# Patient Record
Sex: Male | Born: 1990 | Race: Black or African American | Hispanic: No | Marital: Single
Health system: Southern US, Community
[De-identification: ages and names within clinical notes are randomized; demographics above are authoritative.]

## PROBLEM LIST (undated history)

## (undated) DIAGNOSIS — F209 Schizophrenia, unspecified: Secondary | ICD-10-CM

## (undated) DIAGNOSIS — H9190 Unspecified hearing loss, unspecified ear: Secondary | ICD-10-CM

---
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
## 2011-08-09 ENCOUNTER — Emergency Department: Payer: Self-pay

## 2011-09-09 ENCOUNTER — Emergency Department: Payer: Self-pay | Admitting: *Deleted

## 2011-10-21 ENCOUNTER — Emergency Department: Payer: Self-pay | Admitting: *Deleted

## 2012-04-07 ENCOUNTER — Emergency Department: Payer: Self-pay | Admitting: Emergency Medicine

## 2012-04-07 LAB — COMPREHENSIVE METABOLIC PANEL
Albumin: 3.5 g/dL (ref 3.4–5.0)
Anion Gap: 8 (ref 7–16)
Bilirubin,Total: 0.4 mg/dL (ref 0.2–1.0)
Calcium, Total: 8.6 mg/dL (ref 8.5–10.1)
Chloride: 109 mmol/L — ABNORMAL HIGH (ref 98–107)
Creatinine: 0.85 mg/dL (ref 0.60–1.30)
EGFR (African American): >60
EGFR (Non-African Amer.): >60
Osmolality: 282 (ref 275–301)
Potassium: 3.8 mmol/L (ref 3.5–5.1)
SGOT(AST): 24 U/L (ref 15–37)
SGPT (ALT): 20 U/L
Total Protein: 6.8 g/dL (ref 6.4–8.2)

## 2012-04-07 LAB — SALICYLATE LEVEL: Salicylates, Serum: <1.7 mg/dL

## 2012-04-07 LAB — DRUG SCREEN, URINE
Amphetamines, Ur Screen: NEGATIVE (ref ?–1000)
Barbiturates, Ur Screen: NEGATIVE (ref ?–200)
Benzodiazepine, Ur Scrn: POSITIVE (ref ?–200)
Cannabinoid 50 Ng, Ur ~~LOC~~: POSITIVE (ref ?–50)
Cocaine Metabolite,Ur ~~LOC~~: NEGATIVE (ref ?–300)
MDMA (Ecstasy)Ur Screen: NEGATIVE (ref ?–500)
Methadone, Ur Screen: NEGATIVE (ref ?–300)
Opiate, Ur Screen: NEGATIVE (ref ?–300)
Phencyclidine (PCP) Ur S: NEGATIVE (ref ?–25)
Tricyclic, Ur Screen: NEGATIVE (ref ?–1000)

## 2012-04-07 LAB — URINALYSIS, COMPLETE
Bacteria: NONE SEEN
Blood: NEGATIVE
Nitrite: NEGATIVE
Specific Gravity: 1.021 (ref 1.003–1.030)
Squamous Epithelial: 1
WBC UR: <1 /HPF (ref 0–5)

## 2012-04-07 LAB — CBC
HCT: 38.1 % — ABNORMAL LOW (ref 40.0–52.0)
MCHC: 33.6 g/dL (ref 32.0–36.0)
MCV: 86 fL (ref 80–100)
Platelet: 198 10*3/uL (ref 150–440)
RDW: 14.3 % (ref 11.5–14.5)
WBC: 4.4 10*3/uL (ref 3.8–10.6)

## 2012-04-07 LAB — TSH: Thyroid Stimulating Horm: 1.05 u[IU]/mL

## 2012-04-07 LAB — ETHANOL
Ethanol %: <0.003 % (ref 0.000–0.080)
Ethanol: <3 mg/dL

## 2012-09-10 ENCOUNTER — Emergency Department: Payer: Self-pay | Admitting: Emergency Medicine

## 2012-09-10 LAB — ETHANOL: Ethanol %: <0.003 % (ref 0.000–0.080)

## 2012-10-29 ENCOUNTER — Emergency Department: Payer: Self-pay | Admitting: Emergency Medicine

## 2014-01-26 ENCOUNTER — Observation Stay: Payer: Self-pay | Admitting: Internal Medicine

## 2014-01-26 LAB — CBC WITH DIFFERENTIAL/PLATELET
BASOS PCT: 0.8 %
Basophil #: 0.1 10*3/uL (ref 0.0–0.1)
EOS ABS: 0.1 10*3/uL (ref 0.0–0.7)
Eosinophil %: 1.2 %
HCT: 42.9 % (ref 40.0–52.0)
HGB: 14 g/dL (ref 13.0–18.0)
LYMPHS ABS: 1.8 10*3/uL (ref 1.0–3.6)
Lymphocyte %: 21.3 %
MCH: 28.4 pg (ref 26.0–34.0)
MCHC: 32.6 g/dL (ref 32.0–36.0)
MCV: 87 fL (ref 80–100)
MONO ABS: 0.5 x10 3/mm (ref 0.2–1.0)
MONOS PCT: 6.3 %
Neutrophil #: 5.8 10*3/uL (ref 1.4–6.5)
Neutrophil %: 70.4 %
Platelet: 199 10*3/uL (ref 150–440)
RBC: 4.92 10*6/uL (ref 4.40–5.90)
RDW: 13.9 % (ref 11.5–14.5)
WBC: 8.3 10*3/uL (ref 3.8–10.6)

## 2014-01-26 LAB — BASIC METABOLIC PANEL
Anion Gap: 14 (ref 7–16)
Anion Gap: 4 — ABNORMAL LOW (ref 7–16)
BUN: 14 mg/dL (ref 7–18)
BUN: 13 mg/dL (ref 7–18)
CALCIUM: 9 mg/dL (ref 8.5–10.1)
Calcium, Total: 9 mg/dL (ref 8.5–10.1)
Chloride: 103 mmol/L (ref 98–107)
Chloride: 108 mmol/L — ABNORMAL HIGH (ref 98–107)
Co2: 30 mmol/L (ref 21–32)
Co2: 26 mmol/L (ref 21–32)
Creatinine: 1.25 mg/dL (ref 0.60–1.30)
Creatinine: 1 mg/dL (ref 0.60–1.30)
EGFR (African American): >60
EGFR (Non-African Amer.): >60
GLUCOSE: 88 mg/dL (ref 65–99)
Glucose: 130 mg/dL — ABNORMAL HIGH (ref 65–99)
Osmolality: 292 (ref 275–301)
Osmolality: 278 (ref 275–301)
POTASSIUM: 3.3 mmol/L — AB (ref 3.5–5.1)
Potassium: 3.8 mmol/L (ref 3.5–5.1)
SODIUM: 147 mmol/L — AB (ref 136–145)
Sodium: 138 mmol/L (ref 136–145)

## 2014-01-26 LAB — CK TOTAL AND CKMB (NOT AT ARMC)
CK, Total: 280 U/L
CK, Total: 231 U/L
CK-MB: 1.3 ng/mL (ref 0.5–3.6)
CK-MB: 1.2 ng/mL (ref 0.5–3.6)

## 2014-01-26 LAB — URINALYSIS, COMPLETE
Bacteria: NONE SEEN
Bilirubin,UR: NEGATIVE
Glucose,UR: NEGATIVE mg/dL (ref 0–75)
Ketone: NEGATIVE
Leukocyte Esterase: NEGATIVE
Nitrite: NEGATIVE
Ph: 6 (ref 4.5–8.0)
Protein: NEGATIVE
RBC, UR: NONE SEEN /HPF (ref 0–5)
SQUAMOUS EPITHELIAL: NONE SEEN
Specific Gravity: 1.001 (ref 1.003–1.030)
WBC UR: NONE SEEN /HPF (ref 0–5)

## 2014-01-26 LAB — DRUG SCREEN, URINE

## 2014-01-26 LAB — TROPONIN I
Troponin-I: <0.02 ng/mL
Troponin-I: <0.02 ng/mL

## 2014-01-27 LAB — CBC WITH DIFFERENTIAL/PLATELET
Basophil #: 0 10*3/uL (ref 0.0–0.1)
Basophil %: 0.6 %
Eosinophil #: 0.1 10*3/uL (ref 0.0–0.7)
Eosinophil %: 2.4 %
HCT: 39.7 % — ABNORMAL LOW (ref 40.0–52.0)
HGB: 13.4 g/dL (ref 13.0–18.0)
Lymphocyte #: 1.9 10*3/uL (ref 1.0–3.6)
Lymphocyte %: 34.6 %
MCH: 29.6 pg (ref 26.0–34.0)
MCHC: 33.8 g/dL (ref 32.0–36.0)
MCV: 88 fL (ref 80–100)
MONO ABS: 0.4 x10 3/mm (ref 0.2–1.0)
Monocyte %: 7.9 %
Neutrophil #: 3.1 10*3/uL (ref 1.4–6.5)
Neutrophil %: 54.5 %
Platelet: 180 10*3/uL (ref 150–440)
RBC: 4.53 10*6/uL (ref 4.40–5.90)
RDW: 13.8 % (ref 11.5–14.5)
WBC: 5.6 10*3/uL (ref 3.8–10.6)

## 2014-01-27 LAB — BASIC METABOLIC PANEL
Anion Gap: 6 — ABNORMAL LOW (ref 7–16)
BUN: 12 mg/dL (ref 7–18)
CALCIUM: 8.7 mg/dL (ref 8.5–10.1)
CHLORIDE: 109 mmol/L — AB (ref 98–107)
Co2: 24 mmol/L (ref 21–32)
Creatinine: 1.02 mg/dL (ref 0.60–1.30)
EGFR (Non-African Amer.): >60
Glucose: 94 mg/dL (ref 65–99)
OSMOLALITY: 277 (ref 275–301)
POTASSIUM: 4.2 mmol/L (ref 3.5–5.1)
Sodium: 139 mmol/L (ref 136–145)

## 2014-01-27 LAB — MAGNESIUM: MAGNESIUM: 1.6 mg/dL — AB

## 2014-01-27 LAB — LIPID PANEL
CHOLESTEROL: 104 mg/dL (ref 0–200)
HDL Cholesterol: 41 mg/dL (ref 40–60)
Ldl Cholesterol, Calc: 48 mg/dL (ref 0–100)
Triglycerides: 77 mg/dL (ref 0–200)
VLDL CHOLESTEROL, CALC: 15 mg/dL (ref 5–40)

## 2014-02-17 ENCOUNTER — Emergency Department: Payer: Self-pay | Admitting: Emergency Medicine

## 2014-05-13 ENCOUNTER — Emergency Department: Payer: Self-pay | Admitting: Emergency Medicine

## 2014-09-18 ENCOUNTER — Emergency Department: Payer: Self-pay | Admitting: Emergency Medicine

## 2014-09-22 ENCOUNTER — Emergency Department: Payer: Self-pay | Admitting: Emergency Medicine

## 2014-11-24 ENCOUNTER — Ambulatory Visit: Payer: Self-pay | Admitting: Nurse Practitioner

## 2014-11-30 ENCOUNTER — Ambulatory Visit: Payer: Self-pay | Admitting: Nurse Practitioner

## 2015-03-06 ENCOUNTER — Emergency Department
Admit: 2015-03-06 | Disposition: A | Discharge status: Home / Self Care | Payer: Self-pay | Admitting: Emergency Medicine

## 2015-03-06 LAB — URINALYSIS, COMPLETE
BACTERIA: NONE SEEN
Bilirubin,UR: NEGATIVE
GLUCOSE, UR: NEGATIVE mg/dL (ref 0–75)
Ketone: NEGATIVE
LEUKOCYTE ESTERASE: NEGATIVE
Nitrite: NEGATIVE
Ph: 6 (ref 4.5–8.0)
Protein: NEGATIVE
SPECIFIC GRAVITY: 1.008 (ref 1.003–1.030)

## 2015-03-06 LAB — COMPREHENSIVE METABOLIC PANEL
ALK PHOS: 66 U/L
ANION GAP: 9 (ref 7–16)
Albumin: 3.9 g/dL
BILIRUBIN TOTAL: 0.8 mg/dL
BUN: 12 mg/dL
CO2: 29 mmol/L
CREATININE: 1.05 mg/dL
Calcium, Total: 9.4 mg/dL
Chloride: 97 mmol/L — ABNORMAL LOW
EGFR (African American): >60
Glucose: 111 mg/dL — ABNORMAL HIGH
Potassium: 3.7 mmol/L
SGOT(AST): 28 U/L
SGPT (ALT): 19 U/L
SODIUM: 135 mmol/L
Total Protein: 8.2 g/dL — ABNORMAL HIGH

## 2015-03-06 LAB — CBC WITH DIFFERENTIAL/PLATELET
BASOS ABS: 0 10*3/uL (ref 0.0–0.1)
Basophil %: 0.7 %
EOS PCT: 0.5 %
Eosinophil #: 0 10*3/uL (ref 0.0–0.7)
HCT: 41.5 % (ref 40.0–52.0)
HGB: 13.8 g/dL (ref 13.0–18.0)
Lymphocyte #: 1.3 10*3/uL (ref 1.0–3.6)
Lymphocyte %: 20 %
MCH: 28.3 pg (ref 26.0–34.0)
MCHC: 33.2 g/dL (ref 32.0–36.0)
MCV: 85 fL (ref 80–100)
MONO ABS: 0.7 x10 3/mm (ref 0.2–1.0)
MONOS PCT: 10.4 %
Neutrophil #: 4.5 10*3/uL (ref 1.4–6.5)
Neutrophil %: 68.4 %
PLATELETS: 249 10*3/uL (ref 150–440)
RBC: 4.87 10*6/uL (ref 4.40–5.90)
RDW: 13.5 % (ref 11.5–14.5)
WBC: 6.6 10*3/uL (ref 3.8–10.6)

## 2015-03-06 LAB — LIPASE, BLOOD: LIPASE: 31 U/L

## 2015-03-13 NOTE — Consult Note (Signed)
   Present Illness 24 year old male with no prior cardiac history but a history of hypertension who was admitted after developing left-sided shoulder and neck discomfort with twitching in his left arm shoulder and neck. He was on an aggressive antihypertensive regimen including clonidine. He had been noncompliant with his clonidine.  He is ruled out for myocardial infarction. Echocardiogram revealed normal LV function with no significant valvular abnormalities normal chamber size.   Physical Exam:  GEN well developed, well nourished, no acute distress   HEENT PERRL, hearing intact to voice   NECK supple   RESP normal resp effort  clear BS   CARD Regular rate and rhythm  Normal, S1, S2  No murmur   ABD denies tenderness  no hernia  normal BS   LYMPH negative neck, negative axillae   EXTR negative cyanosis/clubbing, negative edema   SKIN normal to palpation   NEURO cranial nerves intact, motor/sensory function intact   PSYCH A+O to time, place, person   Review of Systems:  Subjective/Chief Complaint left arm and shoulder twitching with some associated discomfort   General: No Complaints   Skin: No Complaints   ENT: No Complaints   Eyes: No Complaints   Neck: No Complaints   Respiratory: No Complaints   Cardiovascular: Chest pain or discomfort   Gastrointestinal: No Complaints   Genitourinary: No Complaints   Vascular: No Complaints   Musculoskeletal: Muscle or joint pain   Neurologic: Dizzness   Hematologic: No Complaints   Endocrine: No Complaints   Psychiatric: No Complaints   Review of Systems: All other systems were reviewed and found to be negative   Medications/Allergies Reviewed Medications/Allergies reviewed   EKG:  EKG NSR  nml intervals   Interpretation no evidence of ischemia    No Known Allergies:    Impression 24 year old male with history of spasms in his left shoulder and neck. He also had associated chest pain and near syncope with  this. He is ruled out for myocardial infarction. He still has some chest discomfort. Echocardiogram showed no significant valvular structural abnormalities. Etiology of his symptoms does not appear to be ischemic however patient continues to have discomfort.   Plan 1. Continue with current medications following his symptoms overnight 2. Ambulate in a.m. and if stable consider discharge. If he continues to have symptoms, would proceed with an ETT without imaging to assess for exercise-induced arrhythmia or ischemia  3. Continue current medications for hypertension   Electronic Signatures: Dalia HeadingFath, Kenneth A (MD)  (Signed 09-Mar-15 14:38)  Authored: General Aspect/Present Illness, History and Physical Exam, Review of System, EKG , Allergies, Impression/Plan   Last Updated: 09-Mar-15 14:38 by Dalia HeadingFath, Kenneth A (MD)

## 2015-03-13 NOTE — H&P (Signed)
PATIENT NAME:  Jeffery [name], DRUDGE MR#:  [id number] DATE OF BIRTH:  08/01/1991  DATE OF ADMISSION:  01/26/2014  PRIMARY CARE PHYSICIAN: Nonlocal.   REFERRING PHYSICIAN: Dr. Margarita Grizzle.   CHIEF COMPLAINT: Chest pain and passed out 2 times in the past 24 hours.   HISTORY OF PRESENT ILLNESS: The patient is a 24 year old African American male with a chronic history of anxiety on clonazepam, ran out of the clonazepam approximately 10 days ago, is brought into the ER for chest pain and after he had two episodes of syncope. According to the patient's friend, who lives next to him,  the patient was complaining of chest pain the day before yesterday and after complaining of chest pain for an hour,  he was found on the floor and hit his head. The patient was found with loss of consciousness for 2 to 3 minutes and he regained his consciousness spontaneously. He had syncope  yesterday evening at around supper time. The patient had another episode of syncope, and at that time, he was having twitching and jerky movements of both upper and lower extremities. This time it lasted for five minutes approximately.  Family called EMS, and the patient is brought into the ER. CT head is negative. Chest x-ray is negative. 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 Chest pain resolved after giving aspirin in the ER. A 12-lead EKG has revealed some ST depression in lead 3. Initial set of troponins were negative. Hospitalist team is called to admit the patient. During my examination, the patient is resting comfortably and denies any complaints. He is very sleepy but arousable and answering questions appropriately.   PAST MEDICAL HISTORY: Chronic sinusitis. Chronic anxiety.   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  ALLERGIES:  He has no known drug allergies.   PSYCHOSOCIAL HISTORY: Lives at home with a group of friends. Denies any history of smoking, alcohol or illicit drug  usage.   FAMILY HISTORY: Mom deceased in a motor vehicle accident when he was [age]. Dad had some heart condition.   MEDICATIONS: The patient is on clonazepam on a daily basis and dose is unknown at this time.   REVIEW OF SYSTEMS:  CONSTITUTIONAL: Denies any fever, fatigue.  EYES: Denies blurry vision, double vision.  ENT: Denies epistaxis, discharge, has a cochlear implant. Denies any snoring, postnasal drip. RESPIRATION: Denies cough, COPD.  CARDIOVASCULAR: Chest pain-free during my examination. No palpitations, syncope.  GASTROINTESTINAL: Denies nausea, vomiting, diarrhea.  GENITOURINARY: No dysuria or hematuria.  ENDOCRINE: Denies polyuria, nocturia, thyroid problems.  HEMATOLOGIC: No anemia, easy bruising or bleeding. INTEGUMENT: No acne, rash, lesions. MUSCULOSKELETAL: No joint pain in the neck and back.  PSYCHIATRIC: Has chronic history of anxiety. Denies any OCD.    PHYSICAL EXAMINATION:  VITAL SIGNS: Temperature 98 degrees Fahrenheit, pulse 61, respirations 18, blood pressure 128/67, pulse oximetry 98%.  GENERAL APPEARANCE: Not in any acute distress. Moderately built and nourished. It was 4:00 in the morning, the patient was deep sleep, but arousable and answering questions accurately.  HEENT: Normocephalic, atraumatic. Pupils are equally reacting to light and accommodation. No scleral icterus. No conjunctival injection. No sinus tenderness. Positive intact cochlear implant on the back of the right ear. Moist mucous membranes.  NECK: Supple. No JVD. No thyromegaly. Range of motion is intact.  LUNGS: Clear to auscultation bilaterally. No accessory muscle usage. No anterior chest wall  tenderness on palpation.  CARDIAC: S1, S2 normal. Regular rate  and rhythm. No murmurs.  GASTROINTESTINAL: Soft. Bowel sounds are positive in all four quadrants. Nontender, nondistended. No hepatosplenomegaly. No masses felt.  NEUROLOGIC: Lethargic but arousable and answering questions accurately.  Moving all  extremities spontaneously. Reflexes are 2+.  EXTREMITIES: No edema. No cyanosis. No clubbing.  SKIN: Warm to touch. Normal turgor. No rashes. No lesions.   MUSCULOSKELETAL: No joint effusion, tenderness, erythema.   LABORATORY AND IMAGING STUDIES: Accu-Chek 121. Urine drug screen is negative. Troponin less than 0.02. CBC is normal.   Urinalysis: Colorless in appearance. Nitrites negative, leuk esterase negative, protein negative,  sodium 147, potassium 3.3. The rest of the Chem-8 is normal. CT of the head without contrast, the ventricles and sulci are normal. No intraparenchymal hemorrhage. No in midline shift. No acute large vascular territory infarct. No abnormal extra-axial fluid collection  . No acute intracranial process on the CT head.  Portable chest x-ray, no acute cardiopulmonary disease. A 12-lead EKG. EKG with normal sinus rhythm at 65 beats per minute and ST depression in lead 3.   ASSESSMENT AND PLAN: A 24 year old [place] male with chest pain and syncopal episode 2 times in the past 24 hours will be admitted with following assessment and plan.  1. Chest pain with syncope. Differential could be cardiogenic syncope versus benzo withdrawal seizures. Restart clonazepam, the patient home medication. CT head is negative. Echocardiogram is pending. Follow-up on telemetry. Cycle cardiac biomarkers. Cardiology consult is placed to Dr. Darrold JunkerParaschos. The patient could not recall his cardiologist  name. We will put a consult to neurology also.  2. EEG.  3. Resume clonazepam after obtaining  home medication list and doses.  4. Chronic anxiety. He used to take clonazepam on a regular basis, which he ran out of clonazepam two weeks ago approximately.  5. Chronic sinusitis.   Diagnosis and plan of care was discussed in detail with the patient. He is aware of the plan.   TOTAL TIME SPENT ON ADMISSION: 45 minutes.    ____________________________ Ramonita LabAruna Kawhi Diebold,  MD ag:sg D: 01/26/2014 05:04:41 ET T: 01/26/2014 07:30:38 ET JOB#: [id number]  cc: Ramonita LabAruna Kreston Ahrendt, MD, <Dictator> Ramonita LabARUNA Jonthan Leite MD ELECTRONICALLY SIGNED 02/19/2014 2:35

## 2015-03-13 NOTE — Consult Note (Signed)
Brief Consult Note: Diagnosis: Anxiety disorder NOS.   Patient was seen by consultant.   Consult note dictated.   Recommend further assessment or treatment.   Comments: Mr. Jeffery [name] has a h/o mild anxiety on Clonazepam in the past. He was admitted after a syncopal episode with chest pain and muscle twitching. He feels better now.  PLAN: 1. I concur with restarting low dose clonazepam.  2. Will connect him with mental health.  3. I will folow along.  Electronic Signatures: Kristine LineaPucilowska, Hoover Grewe (MD)  (Signed 09-Mar-15 20:46)  Authored: Brief Consult Note   Last Updated: 09-Mar-15 20:46 by Kristine LineaPucilowska, Kia Stavros (MD)

## 2015-03-13 NOTE — Discharge Summary (Signed)
PATIENT NAME:  Jeffery [name], Jeffery [name] MR#:  [id number] DATE OF BIRTH:  30-Jun-1991  DATE OF ADMISSION:  01/26/2014 DATE OF DISCHARGE:  01/27/2014  PRIMARY CARE PHYSICIAN:  Will be the doctor on his Medicaid card. He will need to follow up with mental health.   FINAL DIAGNOSES: 1.  Syncope, chest pain, possible chronic and withdrawal seizure.  2.  Hypokalemia and hypomagnesemia.  3.  Hypernatremia.   MEDICATIONS ON DISCHARGE: Include clonazepam 0.5 mg twice a day, magnesium oxide 400 mg once a day for 14 days.   DIET: Regular diet, regular consistency.   FOLLOWUP: Mental health clinic 1 to 2 weeks with the doctor on your Medicaid card.   HOSPITAL COURSE:  The patient was admitted as an observation on January 20, 2014, discharged January 27, 2014. Came in with multiple complaints, chest pain and passing out twice. The patient was admitted as an observation. Klonopin was restarted. He was admitted to telemetry, given IV fluid hydration.   LABORATORY, DIAGNOSTIC AND RADIOLOGICAL DATA DURING THE HOSPITAL COURSE: Included a urine toxicology that was negative. Cardiac enzymes negative. Urinalysis showed 1+ blood, otherwise negative. Glucose 88, BUN 14, creatinine 1.25, sodium 147, potassium 3.3, chloride 103, CO2 of 30, calcium 9.0. White blood cell count 8.3, H and H 14.40 and 42.9, platelet count 199. EKG showed normal sinus rhythm, I am not seeing the ST elevation that was commented on on the initial EKG. Chest x-ray showed no acute cardiopulmonary disease. CT scan no acute intracranial process. Next 2 cardiac enzymes were negative. Echocardiogram, EF 60% to 65%, mildly increased left ventricular septal thickness, mild concentric left ventricular hypertrophy. Magnesium upon discharge 1.6, creatinine 1.02, potassium 4.2. LDL 48, HDL 41, triglycerides 77. EKG repeat showed normal sinus rhythm, 62 beats per minute, no acute ST-[name] wave changes. The patient also had an EEG which was normal.   CONSULTANTS DURING THE  HOSPITAL COURSE: Included Dr. Jennet MaduroPucilowska, psychiatry, who agreed with restarting the patient's Klonopin and connecting him with mental health. Dr. Durene CalHemang K. Sherryll BurgerShah, neurology, who stated he cannot rule out a benzo withdrawal-related seizure. Cardiology; Dr. Lady GaryFath, who just recommended observing.   HOSPITAL COURSE PER PROBLEM LIST:  1.  For the patient's syncope and chest pain, could have been a possible Klonopin withdrawal seizure. The patient felt well on the day of discharge, no recurrent symptoms.  2.  Hypokalemia and hypomagnesemia. These electrolytes were replaced. The patient felt much better and was discharged home. Magnesium will be given as oral replacement upon discharge. Potassium was replaced back into the normal range.  3.  Hypernatremia. The patient was not eating and drinking very well prior to coming into the hospital, may have been the cause of this. Sodium normal range upon discharge.   TIME SPENT ON DISCHARGE: 35 minutes.   ____________________________ Herschell Dimesichard J. Renae GlossWieting, MD rjw:cs D: 01/27/2014 15:19:29 ET [name]: 01/27/2014 15:45:07 ET JOB#: [id number]  cc: Herschell Dimesichard J. Renae GlossWieting, MD, <Dictator> Salley ScarletICHARD J Marri Mcneff MD ELECTRONICALLY SIGNED 02/07/2014 15:08

## 2015-03-13 NOTE — Consult Note (Signed)
PATIENT NAME:  Jeffery [name], Jeffery [name] MR#:  [id number] DATE OF BIRTH:  December 08, 1990  DATE OF ADMISSION:  01/26/2014 DATE OF CONSULTATION:  01/26/2014  REFERRING PHYSICIAN:  Ramonita Lab, MD CONSULTING PHYSICIAN:  Mica Releford B. Mirayah Wren, MD  REASON FOR CONSULTATION: To evaluate a patient with anxiety.   IDENTIFYING DATA: Mr. Camey is a 24 year old male with history of anxiety.   CHIEF COMPLAINT: "I fainted."   HISTORY OF PRESENT ILLNESS: Mr. Agerton is a healthy young man. 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 In the past, he was prescribed some clonazepam for anxiety. It is unclear when was the last time the patient took clonazepam. He told me that after his car broke down a year ago, he was no longer able to travel to see his doctors. Elsewhere in the chart, I hear that the last time he took his clonazepam was 10 days ago. I did not have time to investigate today. The patient reports that when he stopped taking clonazepam he did not have any symptoms of withdrawal, but would like to resume treatment if possible. Prior to admission, the patient lost consciousness for several minutes and hit his head. He did not come to the hospital right away. The following day, when a similar episode happened, he was taken by his friends to the Emergency Room. His head CT scan was normal. He was admitted to medical floor for cardiac monitoring. He has not had similar episodes since. He reports that these episodes start with chest pain that radiates to the left shoulder. His left pectoris starts twitching; the same happens to some of his neck muscles, and then he faints.   He reports that never before he experienced similar symptoms. He denies panic attacks or severe anxiety. He denies any symptoms of depression. There are no psychotic symptoms. He denies alcohol, illicit drugs or prescription pill abuse.   PAST PSYCHIATRIC HISTORY: Reportedly, he was seeing a psychiatrist  and was prescribed clonazepam 0.5 mg twice daily. He is unable to give me the name of his psychiatrist. His timeframe is also rather vague. He has never been hospitalized. There were no suicide attempts.   FAMILY PSYCHIATRIC HISTORY: None reported.   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   ALLERGIES: No known drug allergies.   MEDICATIONS ON ADMISSION: None.   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  He was placed in the school for the death at the age of 2. He received excellent rehabilitation, and his speech does not disclose that there is a hearing problem. He has had several, close to 10, surgeries, as he tells me that his ear canals were closed. He received cochlear implant. When talking to him, I did not have an impression that he is hard of hearing, which is in contrast to the impression that his admitting physician had. He graduated from high school, sitting in the front row always. 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 He lives with several friends. He denies any legal trouble or troubles with alcohol or drugs.   REVIEW OF SYSTEMS:  CONSTITUTIONAL: No fevers or chills. No weight changes.  EYES: No double or blurred vision.  ENT: Positive for hearing problems as above.  RESPIRATORY: No shortness of breath or cough.  CARDIOVASCULAR: No chest pain or orthopnea now, but on admission, he  complained of chest pain that accompanied his fainting episodes.  GASTROINTESTINAL: No abdominal pain, nausea, vomiting or diarrhea.  GENITOURINARY: No incontinence or frequency.  ENDOCRINE: No heat or cold intolerance.  LYMPHATIC: No anemia or easy bruising.  INTEGUMENTARY: No acne or rash.  MUSCULOSKELETAL: No muscle or joint pain.  NEUROLOGIC: No tingling or weakness.  PSYCHIATRIC: See history of present illness for details.   PHYSICAL EXAMINATION: VITAL SIGNS: Blood  pressure 113/68, pulse 73, respirations 18, temperature 97.6.  GENERAL: This is a well-developed young male in no acute distress. The rest of the physical examination is deferred to his primary attending.   LABORATORY DATA: Chemistries are within normal limits with blood glucose of 130. Urine tox screen is negative for substances. CBC is within normal limits. Urinalysis is not suggestive of urinary tract infection.   EKG: Normal sinus rhythm. ST elevation considered lateral injury or acute infarct. Cardiac enzymes are negative x 3.   MENTAL STATUS EXAMINATION: The patient is alert and oriented to person, place, time and situation. He is pleasant, polite and cooperative. He is well groomed. He was in hospital   gown. He maintains good eye contact. His speech is of normal rhythm, rate and volume, rather soft. Mood is fine with full affect. Thought process is logical and goal oriented. Thought content: He denies suicidal or homicidal ideation. There are no delusions or paranoia. There are no auditory or visual hallucinations. His cognition is grossly intact. He registers 3 out of 3 and recalls 3 out of 3 objects after 5 minutes. He can spell "world" forwards and backwards. He knows the current president. He is of normal intelligence and good fund of knowledge. His insight and judgment are fair.   DIAGNOSES: AXIS I: Anxiety disorder, not otherwise specified.  AXIS II: Deferred.  AXIS III: Cochlear implant, chronic sinusitis, syncope.  AXIS IV: New-onset physical illness.  AXIS V: Global assessment of functioning 55.   PLAN:   I concur with providing this patient with clonazepam for presumed anxiety.  I will come back tomorrow and try to schedule an appointment with [facility]. He may need community support team to help him with his appointments and transportation. I will follow up.     ____________________________ Ellin GoodieJolanta B. Lalitha Ilyas, MD jbp:dmm D: 01/26/2014 20:42:00 ET T: 01/26/2014 21:03:26  ET JOB#: [id number]  cc: Georgana Romain B. Jennet MaduroPucilowska, MD, <Dictator> Shari ProwsJOLANTA B Axell Trigueros MD ELECTRONICALLY SIGNED 01/31/2014 15:15
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
## 2015-06-17 ENCOUNTER — Encounter: Payer: Self-pay | Admitting: *Deleted

## 2015-06-17 ENCOUNTER — Emergency Department
Admission: EM | Admit: 2015-06-17 | Discharge: 2015-06-17 | Disposition: A | Discharge status: Home / Self Care | Payer: Medicare Other | Attending: Student | Admitting: Student

## 2015-06-17 ENCOUNTER — Emergency Department: Payer: Medicare Other

## 2015-06-17 DIAGNOSIS — S50811A Abrasion of right forearm, initial encounter: Secondary | ICD-10-CM | POA: Insufficient documentation

## 2015-06-17 DIAGNOSIS — S0990XA Unspecified injury of head, initial encounter: Secondary | ICD-10-CM | POA: Diagnosis not present

## 2015-06-17 DIAGNOSIS — S20312A Abrasion of left front wall of thorax, initial encounter: Secondary | ICD-10-CM | POA: Insufficient documentation

## 2015-06-17 DIAGNOSIS — Y9289 Other specified places as the place of occurrence of the external cause: Secondary | ICD-10-CM | POA: Diagnosis not present

## 2015-06-17 DIAGNOSIS — Y998 Other external cause status: Secondary | ICD-10-CM | POA: Insufficient documentation

## 2015-06-17 DIAGNOSIS — S0101XA Laceration without foreign body of scalp, initial encounter: Secondary | ICD-10-CM | POA: Diagnosis not present

## 2015-06-17 DIAGNOSIS — Y9389 Activity, other specified: Secondary | ICD-10-CM | POA: Diagnosis not present

## 2015-06-17 DIAGNOSIS — Z23 Encounter for immunization: Secondary | ICD-10-CM | POA: Insufficient documentation

## 2015-06-17 DIAGNOSIS — S0083XA Contusion of other part of head, initial encounter: Secondary | ICD-10-CM

## 2015-06-17 DIAGNOSIS — T07XXXA Unspecified multiple injuries, initial encounter: Secondary | ICD-10-CM

## 2015-06-17 MED ORDER — TRAMADOL HCL 50 MG PO TABS
50.0000 mg | ORAL_TABLET | Freq: Once | ORAL | Status: AC
  Administered 2015-06-17: 50 mg via ORAL
  Filled 2015-06-17: qty 1

## 2015-06-17 MED ORDER — IBUPROFEN 800 MG PO TABS: 800.0000 mg | ORAL_TABLET | Freq: Three times a day (TID) | ORAL | Status: DC | PRN

## 2015-06-17 MED ORDER — TRAMADOL HCL 50 MG PO TABS: 50.0000 mg | ORAL_TABLET | Freq: Three times a day (TID) | ORAL | Status: DC | PRN

## 2015-06-17 MED ORDER — TETANUS-DIPHTH-ACELL PERTUSSIS 5-2.5-18.5 LF-MCG/0.5 IM SUSP
0.5000 mL | Freq: Once | INTRAMUSCULAR | Status: AC
  Administered 2015-06-17: 0.5 mL via INTRAMUSCULAR
  Filled 2015-06-17: qty 0.5

## 2015-06-17 NOTE — ED Notes (Signed)
Patient in CT Scan

## 2015-06-17 NOTE — ED Notes (Signed)
AAOx3.  Skin warm and dry.  NAD 
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
## 2015-06-17 NOTE — ED Notes (Signed)
AAOx3.  Skin warm and dry.  Moving all extremities.  Ambulating with easy and steady gait.  NAD.  D/C home
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
## 2016-01-26 ENCOUNTER — Emergency Department
Admission: EM | Admit: 2016-01-26 | Discharge: 2016-01-26 | Disposition: A | Discharge status: Home / Self Care | Payer: Medicare Other | Attending: Emergency Medicine | Admitting: Emergency Medicine

## 2016-01-26 DIAGNOSIS — T43221A Poisoning by selective serotonin reuptake inhibitors, accidental (unintentional), initial encounter: Secondary | ICD-10-CM | POA: Diagnosis not present

## 2016-01-26 DIAGNOSIS — Y998 Other external cause status: Secondary | ICD-10-CM | POA: Insufficient documentation

## 2016-01-26 DIAGNOSIS — R51 Headache: Secondary | ICD-10-CM | POA: Diagnosis not present

## 2016-01-26 DIAGNOSIS — Y9389 Activity, other specified: Secondary | ICD-10-CM | POA: Diagnosis not present

## 2016-01-26 DIAGNOSIS — F419 Anxiety disorder, unspecified: Secondary | ICD-10-CM | POA: Diagnosis not present

## 2016-01-26 DIAGNOSIS — Y9289 Other specified places as the place of occurrence of the external cause: Secondary | ICD-10-CM | POA: Insufficient documentation

## 2016-01-26 NOTE — ED Notes (Signed)
Pt reports being anxious and having a headache today. He reports taking 4 or 5 paxil so he could feel better. Pt denies SI/HI. Pt reports he still does not fell well and is having a headache and anxiety
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
## 2016-03-08 ENCOUNTER — Emergency Department
Admission: EM | Admit: 2016-03-08 | Discharge: 2016-03-08 | Disposition: A | Discharge status: Home / Self Care | Payer: Medicare Other | Attending: Emergency Medicine | Admitting: Emergency Medicine

## 2016-03-08 ENCOUNTER — Emergency Department: Payer: Medicare Other

## 2016-03-08 ENCOUNTER — Encounter: Payer: Self-pay | Admitting: Emergency Medicine

## 2016-03-08 DIAGNOSIS — Y939 Activity, unspecified: Secondary | ICD-10-CM | POA: Insufficient documentation

## 2016-03-08 DIAGNOSIS — M545 Low back pain: Secondary | ICD-10-CM | POA: Diagnosis present

## 2016-03-08 DIAGNOSIS — M5412 Radiculopathy, cervical region: Secondary | ICD-10-CM | POA: Diagnosis not present

## 2016-03-08 DIAGNOSIS — Y929 Unspecified place or not applicable: Secondary | ICD-10-CM | POA: Diagnosis not present

## 2016-03-08 DIAGNOSIS — Y999 Unspecified external cause status: Secondary | ICD-10-CM | POA: Insufficient documentation

## 2016-03-08 DIAGNOSIS — Z791 Long term (current) use of non-steroidal anti-inflammatories (NSAID): Secondary | ICD-10-CM | POA: Insufficient documentation

## 2016-03-08 DIAGNOSIS — S39012A Strain of muscle, fascia and tendon of lower back, initial encounter: Secondary | ICD-10-CM | POA: Insufficient documentation

## 2016-03-08 MED ORDER — METHYLPREDNISOLONE 4 MG PO TBPK: ORAL_TABLET | ORAL | Status: DC

## 2016-03-08 MED ORDER — METHOCARBAMOL 750 MG PO TABS: 750.0000 mg | ORAL_TABLET | Freq: Four times a day (QID) | ORAL | Status: DC

## 2016-03-08 MED ORDER — HYDROMORPHONE HCL 1 MG/ML IJ SOLN
1.0000 mg | Freq: Once | INTRAMUSCULAR | Status: AC
  Administered 2016-03-08: 1 mg via INTRAMUSCULAR
  Filled 2016-03-08: qty 1

## 2016-03-08 MED ORDER — DEXAMETHASONE SODIUM PHOSPHATE 10 MG/ML IJ SOLN
10.0000 mg | Freq: Once | INTRAMUSCULAR | Status: AC
  Administered 2016-03-08: 10 mg via INTRAMUSCULAR
  Filled 2016-03-08: qty 1

## 2016-03-08 NOTE — ED Notes (Signed)
Pt presents with low back pain for 2j-3 days, no urinary sx, worse pain when standing, some relief when lying down.
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
## 2016-03-08 NOTE — Discharge Instructions (Signed)
Cervical Radiculopathy Cervical radiculopathy means that a nerve in the neck is pinched or bruised. This can cause pain or loss of feeling (numbness) that runs from your neck to your arm and fingers. HOME CARE Managing Pain  Take over-the-counter and prescription medicines only as told by your doctor.  If directed, put ice on the injured or painful area.  Put ice in a plastic bag.  Place a towel between your skin and the bag.  Leave the ice on for 20 minutes, 2-3 times per day.  If ice does not help, you can try using heat. Take a warm shower or warm bath, or use a heat pack as told by your doctor.  You may try a gentle neck and shoulder massage. Activity  Rest as needed. Follow instructions from your doctor about any activities to avoid.  Do exercises as told by your doctor or physical therapist. General Instructions   If you were given a soft collar, wear it as told by your doctor.  Use a flat pillow when you sleep.  Keep all follow-up visits as told by your doctor. This is important. GET HELP IF:  Your condition does not improve with treatment. GET HELP RIGHT AWAY IF:   Your pain gets worse and is not controlled with medicine.  You lose feeling or feel weak in your hand, arm, face, or leg.  You have a fever.  You have a stiff neck.  You cannot control when you poop or pee (have incontinence).  You have trouble with walking, balance, or talking.   This information is not intended to replace advice given to you by your health care provider. Make sure you discuss any questions you have with your health care provider.   Document Released: 10/26/2011 Document Revised: 07/28/2015 Document Reviewed: 12/31/2014 Elsevier Interactive Patient Education 2016 [employer]  

## 2016-06-22 ENCOUNTER — Encounter: Payer: Self-pay | Admitting: Emergency Medicine

## 2016-06-22 ENCOUNTER — Emergency Department
Admission: EM | Admit: 2016-06-22 | Discharge: 2016-06-23 | Disposition: A | Discharge status: Other Acute Inpatient Hospital | Payer: Medicare Other | Attending: Emergency Medicine | Admitting: Emergency Medicine

## 2016-06-22 DIAGNOSIS — F2081 Schizophreniform disorder: Secondary | ICD-10-CM

## 2016-06-22 DIAGNOSIS — Z5181 Encounter for therapeutic drug level monitoring: Secondary | ICD-10-CM | POA: Insufficient documentation

## 2016-06-22 DIAGNOSIS — R45851 Suicidal ideations: Secondary | ICD-10-CM | POA: Diagnosis present

## 2016-06-22 DIAGNOSIS — F17210 Nicotine dependence, cigarettes, uncomplicated: Secondary | ICD-10-CM | POA: Insufficient documentation

## 2016-06-22 LAB — COMPREHENSIVE METABOLIC PANEL
ALBUMIN: 3.8 g/dL (ref 3.5–5.0)
ALT: 14 U/L — AB (ref 17–63)
AST: 22 U/L (ref 15–41)
Alkaline Phosphatase: 66 U/L (ref 38–126)
Anion gap: 4 — ABNORMAL LOW (ref 5–15)
BUN: 15 mg/dL (ref 6–20)
CHLORIDE: 109 mmol/L (ref 101–111)
CO2: 25 mmol/L (ref 22–32)
CREATININE: 0.96 mg/dL (ref 0.61–1.24)
Calcium: 9.1 mg/dL (ref 8.9–10.3)
GFR calc Af Amer: >60 mL/min (ref >60)
GFR calc non Af Amer: >60 mL/min (ref >60)
Glucose, Bld: 103 mg/dL — ABNORMAL HIGH (ref 65–99)
POTASSIUM: 3.6 mmol/L (ref 3.5–5.1)
SODIUM: 138 mmol/L (ref 135–145)
Total Bilirubin: 0.5 mg/dL (ref 0.3–1.2)
Total Protein: 6.2 g/dL — ABNORMAL LOW (ref 6.5–8.1)

## 2016-06-22 LAB — URINE DRUG SCREEN, QUALITATIVE (ARMC ONLY)
AMPHETAMINES, UR SCREEN: NOT DETECTED
Barbiturates, Ur Screen: NOT DETECTED
Benzodiazepine, Ur Scrn: NOT DETECTED
CANNABINOID 50 NG, UR ~~LOC~~: NOT DETECTED
COCAINE METABOLITE, UR ~~LOC~~: NOT DETECTED
MDMA (ECSTASY) UR SCREEN: NOT DETECTED
Methadone Scn, Ur: NOT DETECTED
Opiate, Ur Screen: NOT DETECTED
Phencyclidine (PCP) Ur S: NOT DETECTED
TRICYCLIC, UR SCREEN: NOT DETECTED

## 2016-06-22 LAB — CBC
HCT: 40.9 % (ref 40.0–52.0)
HEMOGLOBIN: 14.1 g/dL (ref 13.0–18.0)
MCH: 30 pg (ref 26.0–34.0)
MCHC: 34.4 g/dL (ref 32.0–36.0)
MCV: 87.2 fL (ref 80.0–100.0)
PLATELETS: 197 10*3/uL (ref 150–440)
RBC: 4.69 MIL/uL (ref 4.40–5.90)
RDW: 14.3 % (ref 11.5–14.5)
WBC: 9.3 10*3/uL (ref 3.8–10.6)

## 2016-06-22 LAB — ACETAMINOPHEN LEVEL: Acetaminophen (Tylenol), Serum: <10 ug/mL — ABNORMAL LOW (ref 10–30)

## 2016-06-22 LAB — ETHANOL: Alcohol, Ethyl (B): <5 mg/dL (ref <5)

## 2016-06-22 LAB — SALICYLATE LEVEL: Salicylate Lvl: <4 mg/dL (ref 2.8–30.0)

## 2016-06-22 NOTE — ED Notes (Signed)
Patient on the phone at this time 

## 2016-06-22 NOTE — ED Notes (Signed)
Patient talking with intake Calvin.
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
## 2016-06-22 NOTE — ED Notes (Signed)
Patient sitting in dayroom, watching television. Patient is aware he will be referred for inpatient treatment. He agrees with this and wants to start medications for his mood. Maintained on 15 minute checks and observation by security camera for safety.

## 2016-06-22 NOTE — BH Assessment (Signed)
Patient is to be admitted to [facility] by Dr. Toni Amend.  Attending Physician will be Dr. Ardyth Harps.   Patient has been assigned to room 311, by [facility] Charge Nurse Gus Height.   Intake Paper Work has been signed and placed on patient chart.  ER staff is aware of the admission Rivka Barbara, ER Sect.; Dr. Don Perking, ER MD; Demetrios Isaacs., Patient's Nurse & Angelique Blonder, Patient Access).

## 2016-06-22 NOTE — ED Notes (Signed)
Report was received Amy H., RN; Pt. Verbalizes no complaints or distress;  Verbalizes having S.I.; with no stated plan; denies having Hi.; with paranoia and delusions.  Continue to monitor with 15 min. Monitoring.
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
## 2016-06-22 NOTE — ED Notes (Signed)
Lunch was given to patient 
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
## 2016-06-22 NOTE — ED Notes (Signed)
Patient meeting with psychiatrist. Meal tray served.
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
## 2016-06-22 NOTE — ED Notes (Signed)

## 2016-06-22 NOTE — ED Notes (Signed)
edp at bedside  

## 2016-06-22 NOTE — ED Notes (Signed)

## 2016-06-23 ENCOUNTER — Encounter: Payer: Self-pay | Admitting: *Deleted

## 2016-06-23 ENCOUNTER — Inpatient Hospital Stay
Admission: RE | Admit: 2016-06-23 | Discharge: 2016-06-26 | DRG: 885 | Disposition: A | Discharge status: Home / Self Care | Payer: Medicare Other | Source: Intra-hospital | Attending: Psychiatry | Admitting: Psychiatry

## 2016-06-23 DIAGNOSIS — F332 Major depressive disorder, recurrent severe without psychotic features: Secondary | ICD-10-CM | POA: Diagnosis present

## 2016-06-23 DIAGNOSIS — G4700 Insomnia, unspecified: Secondary | ICD-10-CM | POA: Diagnosis present

## 2016-06-23 DIAGNOSIS — R45851 Suicidal ideations: Secondary | ICD-10-CM | POA: Diagnosis present

## 2016-06-23 DIAGNOSIS — F419 Anxiety disorder, unspecified: Secondary | ICD-10-CM | POA: Diagnosis present

## 2016-06-23 DIAGNOSIS — F329 Major depressive disorder, single episode, unspecified: Secondary | ICD-10-CM | POA: Diagnosis not present

## 2016-06-23 DIAGNOSIS — Z62810 Personal history of physical and sexual abuse in childhood: Secondary | ICD-10-CM | POA: Diagnosis present

## 2016-06-23 DIAGNOSIS — H9190 Unspecified hearing loss, unspecified ear: Secondary | ICD-10-CM | POA: Diagnosis present

## 2016-06-23 DIAGNOSIS — F17200 Nicotine dependence, unspecified, uncomplicated: Secondary | ICD-10-CM

## 2016-06-23 DIAGNOSIS — F17210 Nicotine dependence, cigarettes, uncomplicated: Secondary | ICD-10-CM | POA: Diagnosis present

## 2016-06-23 HISTORY — DX: Schizophrenia, unspecified: F20.9

## 2016-06-23 LAB — LIPID PANEL
CHOL/HDL RATIO: 2.8 ratio
Cholesterol: 155 mg/dL (ref 0–200)
HDL: 56 mg/dL (ref >40)
LDL CALC: 87 mg/dL (ref 0–99)
Triglycerides: 62 mg/dL (ref <150)
VLDL: 12 mg/dL (ref 0–40)

## 2016-06-23 LAB — TSH: TSH: 1.141 u[IU]/mL (ref 0.350–4.500)

## 2016-06-23 LAB — HEMOGLOBIN A1C: Hgb A1c MFr Bld: 5.7 % (ref 4.0–6.0)

## 2016-06-23 MED ORDER — TRAZODONE HCL 100 MG PO TABS
100.0000 mg | ORAL_TABLET | Freq: Every evening | ORAL | Status: DC | PRN
  Administered 2016-06-25: 100 mg via ORAL
  Filled 2016-06-23 (×2): qty 1

## 2016-06-23 MED ORDER — LORAZEPAM 2 MG PO TABS
2.0000 mg | ORAL_TABLET | ORAL | Status: DC | PRN
  Administered 2016-06-25: 2 mg via ORAL
  Filled 2016-06-23: qty 1

## 2016-06-23 MED ORDER — PAROXETINE HCL 20 MG PO TABS
20.0000 mg | ORAL_TABLET | Freq: Every day | ORAL | Status: DC
  Administered 2016-06-23 – 2016-06-26 (×4): 20 mg via ORAL
  Filled 2016-06-23 (×4): qty 1

## 2016-06-23 MED ORDER — ACETAMINOPHEN 325 MG PO TABS: 650.0000 mg | ORAL_TABLET | Freq: Four times a day (QID) | ORAL | Status: DC | PRN

## 2016-06-23 MED ORDER — ALUM & MAG HYDROXIDE-SIMETH 200-200-20 MG/5ML PO SUSP: 30.0000 mL | ORAL | Status: DC | PRN

## 2016-06-23 MED ORDER — MAGNESIUM HYDROXIDE 400 MG/5ML PO SUSP: 30.0000 mL | Freq: Every day | ORAL | Status: DC | PRN

## 2016-06-23 NOTE — ED Notes (Signed)
Patient is oriented, no signs of distress, Patient is being transferred to [facility], all belongings taken with him, He is escorted by Patent examiner and nurse.

## 2016-06-23 NOTE — [facility] Group Notes (Signed)
[facility] Group Notes:  (Nursing/MHT/Case Management/Adjunct)  Date:  06/23/2016  Time:  3:55 PM  Type of Therapy:  Psychoeducational Skills  Participation Level:  Active  Participation Quality:  Appropriate, Attentive and Supportive  Affect:  Appropriate  Cognitive:  Appropriate  Insight:  Appropriate  Engagement in Group:  Engaged  Modes of Intervention:  Discussion and Education  Summary of Progress/Problems:  Jeffery [name] 06/23/2016, 3:55 PM

## 2016-06-23 NOTE — [facility] Group Notes (Deleted)
[facility] LCSW Group Therapy   06/23/2016 9:30 AM   Type of Therapy: Group Therapy   Participation Level: Active   Participation Quality: Attentive, Sharing and Supportive   Affect: Appropriate   Cognitive: Alert and Oriented   Insight: Developing/Improving and Engaged   Engagement in Therapy: Developing/Improving and Engaged   Modes of Intervention: Clarification, Confrontation, Discussion, Education, Exploration, Limit-setting, Orientation, Problem-solving, Rapport Building, Reality Testing, Socialization and Support   Summary of Progress/Problems: The topic for today was feelings about relapse. Pt discussed what relapse prevention is to them and identified triggers that they are on the path to relapse. Pt processed their feeling towards relapse and was able to relate to peers. Pt discussed coping skills that can be used for relapse prevention. Pt reported that for the pt relapse means his mental health being "affected".  Pt requested information on support groups for" people with ADHD"..  Pt reported the pt "talks to people" in order to prevent relapse.   CSW actively validated the pt's opinion and provided feedback.  Pt was polite and cooperative with the CSW and other group members and focused and attentive to the topics discussed and the sharing of others, although pt presented as hyper and nervous.   Jeffery [name], MSW, LCSWA, LCAS   
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
## 2016-06-23 NOTE — [facility] Group Notes (Deleted)
[facility] LCSW Group Therapy   06/23/2016  9:30am  Type of Therapy: Group Therapy   Participation Level: Did Not Attend. Patient invited to participate but declined.    Hiroko Tregre F. Kavion Mancinas, MSW, LCSWA, LCAS     

## 2016-06-23 NOTE — Tx Team (Addendum)
Initial Interdisciplinary Treatment Plan   PATIENT STRESSORS: Marital or family conflict Medication change or noncompliance   PATIENT STRENGTHS: Ability for insight Active sense of humor Capable of independent living Communication skills General fund of knowledge Physical Health Work skills   PROBLEM LIST: Problem List/Patient Goals Date to be addressed Date deferred Reason deferred Estimated date of resolution  Major Depression      Suicidal Ideation                                                 DISCHARGE CRITERIA:  Improved stabilization in mood, thinking, and/or behavior  PRELIMINARY DISCHARGE PLAN: Outpatient therapy  PATIENT/FAMIILY INVOLVEMENT: This treatment plan has been presented to and reviewed with the patient, Jeffery [name], and/or family member, .  The patient and family have been given the opportunity to ask questions and make suggestions.  Jeffery [name] 06/23/2016, 2:43 PM

## 2016-06-23 NOTE — Progress Notes (Signed)
Patient ID: Jeffery [name], male   DOB: 1991/07/02, 25 y.o.   MRN: [id number] PER STATE REGULATIONS 482.30  THIS CHART WAS REVIEWED FOR MEDICAL NECESSITY WITH RESPECT TO THE PATIENT'S ADMISSION/DURATION OF STAY.  NEXT REVIEW DATE:06/27/16  Loura Halt, RN, BSN CASE MANAGER

## 2016-06-23 NOTE — ED Notes (Signed)
Nurse called report to [facility] in [place].

## 2016-06-23 NOTE — [facility] Group Notes (Addendum)
[facility] LCSW Group Therapy   06/23/2016  9:30am  Type of Therapy: Group Therapy   Participation Level: Did Not Attend. Patient invited to participate but declined.    Forrester Blando F. Ercelle Winkles, MSW, LCSWA, LCAS     

## 2016-06-23 NOTE — [facility] Suicide Risk Assessment (Signed)
[facility] INPATIENT:  Family/Significant Other Suicide Prevention Education  Suicide Prevention Education:  Patient Refusal for Family/Significant Other Suicide Prevention Education: The patient Jeffery [name] has refused to provide written consent for family/significant other to be provided Family/Significant Other Suicide Prevention Education during admission and/or prior to discharge.  Physician notified. Pt stated that he does not want family involved at this time. He does not feel that they are trustworthy.  Lynden Oxford, MSW, LCSW-A 06/23/2016, 3:06 PM
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
## 2016-06-23 NOTE — ED Notes (Signed)
Patient is sitting uin in dayroom, He is calm and cooperative, Patient without behavioral issues, patient denies Si/Hi or avh. Patient is safe, camera monitoring in progress and q 15 min. Checks.

## 2016-06-23 NOTE — ED Provider Notes (Signed)
-----------------------------------------   6:42 AM on 06/23/2016 -----------------------------------------   Blood pressure 102/67, pulse 65, temperature 98.2 F (36.8 C), temperature source Tympanic, resp. rate 18, height 5\' 5"  (1.651 m), weight 180 lb (81.6 kg), SpO2 100 %.  The patient had no acute events since last update.  Calm and cooperative at this time.  Disposition is pending per Psychiatry/Behavioral Medicine team recommendations.     Irean Hong, MD 06/23/16 [phone]

## 2016-06-23 NOTE — ED Notes (Signed)
Patient is napping at this present time. Patient without evidence of distress, q 15 min. Checks and camera monitoring in progress.

## 2016-06-23 NOTE — [facility] Suicide Risk Assessment (Signed)
[facility] Admission Suicide Risk Assessment   Nursing information obtained from:  Patient Demographic factors:  Male, Adolescent or young adult Current Mental Status:  NA Loss Factors:  Loss of significant relationship Historical Factors:  NA Risk Reduction Factors:  Religious beliefs about death, Employed, Positive coping skills or problem solving skills  Total Time spent with patient: 1 hour Principal Problem: Depression Diagnosis:   Patient Active Problem List   Diagnosis Date Noted  . Unspecified depression [F32.9] 06/23/2016  . Tobacco use disorder [F17.200] 06/23/2016   Subjective Data:   Continued Clinical Symptoms:  Alcohol Use Disorder Identification Test Final Score (AUDIT): 0 The "Alcohol Use Disorders Identification Test", Guidelines for Use in Primary Care, Second Edition.  World Science writer [facility] Score between 0-7:  no or low risk or alcohol related problems. Score between 8-15:  moderate risk of alcohol related problems. Score between 16-19:  high risk of alcohol related problems. Score 20 or above:  warrants further diagnostic evaluation for alcohol dependence and treatment.   CLINICAL FACTORS:   Severe Anxiety and/or Agitation Depression:   Insomnia Previous Psychiatric Diagnoses and Treatments     Psychiatric Specialty Exam: Physical Exam  ROS  Blood pressure 118/68, pulse 60, temperature 98.4 F (36.9 C), temperature source Oral, resp. rate 16, height 5\' 5"  (1.651 m), weight 80.3 kg (177 lb), SpO2 100 %.Body mass index is 29.45 kg/m.                                                    Sleep:         COGNITIVE FEATURES THAT CONTRIBUTE TO RISK:  Closed-mindedness    SUICIDE RISK:   Mild:  Suicidal ideation of limited frequency, intensity, duration, and specificity.  There are no identifiable plans, no associated intent, mild dysphoria and related symptoms, good self-control (both objective and subjective assessment), few  other risk factors, and identifiable protective factors, including available and accessible social support.   PLAN OF CARE: admit to [facility]  I certify that inpatient services furnished can reasonably be expected to improve the patient's condition.  Jimmy Footman, MD 06/23/2016, 2:59 PM
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
## 2016-06-24 LAB — PROLACTIN: PROLACTIN: 11 ng/mL (ref 4.0–15.2)

## 2016-06-24 MED ORDER — NICOTINE 14 MG/24HR TD PT24
14.0000 mg | MEDICATED_PATCH | Freq: Every day | TRANSDERMAL | Status: DC
  Filled 2016-06-24 (×2): qty 1

## 2016-06-24 NOTE — Plan of Care (Signed)
Problem: Coping: Goal: Ability to demonstrate self-control will improve Outcome: Progressing Patient is pleasant & cooperative.Denies SI & HI.

## 2016-06-24 NOTE — Progress Notes (Signed)
D: Pt denies SI/HI/AVH, Patient noted to be HOH in both ears, patient was addressed facing him so he could  understand the conversation, affect is flat but brightens upon approach, pt appears less anxious and he is interacting with peers and staff appropriately.  A: Pt was offered support and encouragement. Pt was given scheduled medications. Pt was encouraged to attend groups. Q 15 minute checks were done for safety.  R:Pt attends groups and interacts well with peers and staff. Pt is taking medication. Pt has no complaints.Pt receptive to treatment and safety maintained on unit.

## 2016-06-24 NOTE — [facility] Group Notes (Signed)
[facility] LCSW Group Therapy  06/24/2016 3:04 PM  Type of Therapy:  Group Therapy  Participation Level:  Active  Participation Quality:  Appropriate  Affect:  Appropriate  Cognitive:  Appropriate  Insight:  Developing/Improving  Engagement in Therapy:  Engaged  Modes of Intervention:  Activity, Discussion, Education and Support  Summary of Progress/Problems: Self care: Patients discussed self care and how it impacts them. Patients were asked to define self care in their own words. They discussed what aspects in their lives has influenced their self care. Patients also discussed self care in the areas of self regulation/control, hygiene/appearance, sleep/relaxation, healthy leisure, healthy eating habits, exercise, inner peace/spirituality, self improvement, sobriety, and health management. They were challenged to identify changes that are needed in order to improve self care. Pt attended group on this date and was engaged in the group discussion. Pt was able to provide insight to the group discussion and process how he could improve his self-care. CSW provided support to pt as he process his feelings on recent events that has been triggering his anxiety.   Jozey Janco G. Garnette Czech MSW, LCSWA 06/24/2016, 3:07 PM
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
## 2016-06-24 NOTE — Plan of Care (Signed)
Problem: Coping: Goal: Ability to verbalize frustrations and anger appropriately will improve Outcome: Progressing Patient verbalized frustrations to nurse.

## 2016-06-25 MED ORDER — PAROXETINE HCL 20 MG PO TABS: 20.0000 mg | ORAL_TABLET | Freq: Every day | ORAL | 1 refills | Status: AC

## 2016-06-25 MED ORDER — TRAZODONE HCL 100 MG PO TABS: 100.0000 mg | ORAL_TABLET | Freq: Every evening | ORAL | 1 refills | Status: AC | PRN

## 2016-06-25 NOTE — [facility] Group Notes (Signed)
[facility] LCSW Group Therapy  06/25/2016 2:12 PM  Type of Therapy:  Group Therapy  Participation Level:  Active  Participation Quality:  Appropriate  Affect:  Appropriate  Cognitive:  Appropriate  Insight:  Developing/Improving  Engagement in Therapy:  Improving  Modes of Intervention:  Activity, Discussion, Education and Support  Summary of Progress/Problems: Emotional Regulation: Patients will identify both negative and positive emotions. They will discuss emotions they have difficulty regulating and how they impact their lives. Patients will be asked to identify healthy coping skills to combat unhealthy reactions to negative emotions. The group focused on the anger and how to manage anger appropriately.  Pt actively shared during group and was able to state areas that he could improve his anger management.   Soniyah Mcglory G. Garnette CzechSampson MSW, LCSWA 06/25/2016, 2:14 PM

## 2016-06-25 NOTE — Progress Notes (Signed)
Patient is pleasant and cooperative on approach. He denies suicidal or homicidal ideations and AV hallucinations. He was compliant with medicines. He attended groups and Appropriate with staff & peers. Appetite & energy level good.

## 2016-06-25 NOTE — Plan of Care (Signed)
Problem: Coping: Goal: Ability to verbalize frustrations and anger appropriately will improve Outcome: Progressing Patient able to verbalize frustrations appropriately [place] RN

## 2016-06-25 NOTE — Progress Notes (Signed)
D: Patient is alert and oriented on the unit this shift. Patient attended and   participated in groups today. Patient denies suicidal ideation, homicidal ideation, auditory or visual hallucinations at the present time.  A: Scheduled medications are administered to patient as per MD orders. Emotional support and encouragement are provided. Patient is maintained on q.15 minute safety checks. Patient is informed to notify staff with questions or concerns. R: No adverse medication reactions are noted. Patient is cooperative with medication administration and treatment plan today. Patient is receptive,  Anxious but  cooperative on the unit at this time. Patient interacts well with others on the unit this shift. Patient contracts for safety at this time. Patient remains safe at this time.

## 2016-06-25 NOTE — Progress Notes (Signed)
Pt has been pleasant and cooperative . Pt has been active on the unit. Pt has been attending all unit activities. Pt denies SI and A/V hallucinations. Pt's mood and affect has been less depressed.

## 2016-06-25 NOTE — [facility] Group Notes (Signed)
[facility] Group Notes:  (Nursing/MHT/Case Management/Adjunct)  Date:  06/25/2016  Time:  9:31 AM  Type of Therapy:  Goal setting  Participation Level:  Active  Participation Quality:  Appropriate, Attentive and Supportive  Affect:  Appropriate  Cognitive:  Appropriate  Insight:  Appropriate  Engagement in Group:  Engaged and Supportive  Modes of Intervention:  goal setting  Summary of Progress/Problems:  Twanna Hymanda C Trishia Cuthrell 06/25/2016, 9:31 AM
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
## 2016-06-25 NOTE — [facility] Group Notes (Signed)
[facility] Group Notes:  (Nursing/MHT/Case Management/Adjunct)  Date:  06/25/2016  Time:  10:56 PM  Type of Therapy:  Group Therapy  Participation Level:  Active  Participation Quality:  Appropriate  Affect:  Appropriate  Cognitive:  Appropriate  Insight:  Appropriate  Engagement in Group:  Engaged  Modes of Intervention:  n/a  Summary of Progress/Problems:  Jeffery [name] 06/25/2016, 10:56 PM

## 2016-06-26 DIAGNOSIS — F332 Major depressive disorder, recurrent severe without psychotic features: Principal | ICD-10-CM
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
## 2016-06-26 NOTE — [facility] Suicide Risk Assessment (Signed)
[facility]   Principal Problem: Major depressive disorder, recurrent severe without psychotic features [facility](HCC) Discharge Diagnoses:  Patient Active Problem List   Diagnosis Date Noted  . Major depressive disorder, recurrent severe without psychotic features (HCC) [F33.2] 06/23/2016    Priority: High  . Tobacco use disorder [F17.200] 06/23/2016    Total Time spent with patient: 30 minutes  Musculoskeletal: Strength & Muscle Tone: within normal limits Gait & Station: normal Patient leans: N/A  Psychiatric Specialty Exam: Review of Systems  All other systems reviewed and are negative.   Blood pressure 118/73, pulse 66, temperature 98.3 F (36.8 C), temperature source Oral, resp. rate 18, height 5\' 5"  (1.651 m), weight 80.3 kg (177 lb), SpO2 100 %.Body mass index is 29.45 kg/m.  General Appearance: Casual  Eye Contact::  Good  Speech:  Clear and Coherent409  Volume:  Normal  Mood:  Euthymic  Affect:  Appropriate  Thought Process:  Goal Directed  Orientation:  Full (Time, Place, and Person)  Thought Content:  WDL  Suicidal Thoughts:  No  Homicidal Thoughts:  No  Memory:  Immediate;   Fair Recent;   Fair Remote;   Fair  Judgement:  Impaired  Insight:  Present  Psychomotor Activity:  Normal  Concentration:  Fair  Recall:  FiservFair  Fund of Knowledge:Fair  Language: Fair  Akathisia:  No  Handed:  Right  AIMS (if indicated):     Assets:  Communication Skills Desire for Improvement Financial Resources/Insurance Housing Physical Health Resilience Social Support  Sleep:  Number of Hours: 7  Cognition: WNL  ADL's:  Intact   Mental Status Per Nursing Assessment::   On Admission:  NA  Demographic Factors:  Male  Loss Factors: NA  Historical Factors: Impulsivity  Risk Reduction Factors:   Sense of responsibility to family, Employed and Positive social support  Continued Clinical Symptoms:  Depression:   Impulsivity  Cognitive  Features That Contribute To Risk:  None    Suicide Risk:  Minimal: No identifiable suicidal ideation.  Patients presenting with no risk factors but with morbid ruminations; may be classified as minimal risk based on the severity of the depressive symptoms  Follow-up Information    [facility]. Go on 06/30/2016.   Why:  Please arrive to the walk-in clinic at  7AM an assessment for medication management and therapy.  Arrive as early as possible for prompt service. Please call Unk PintoHarvey Bryant at [phone] for questions and assistance. Contact information: [facility] [place] [address] [place] [address] Ph: [phone] Fax: [phone]          Plan Of Care/Follow-up recommendations:  Activity:  as tolerated. Diet:  regular. Other:  keep follow up appopintments.  Kristine LineaJolanta Einar Nolasco, MD 06/26/2016, 9:59 AM

## 2016-06-26 NOTE — Progress Notes (Signed)
  [facility] Discharge Plan :  Will you be returning to the same living situation after discharge:  Yes,  home with family At discharge, do you have transportation home?: Yes,  family member  Do you have the ability to pay for your medications: Yes,  Medicare and Medicaid  Release of information consent forms completed and in the chart;  Patient's signature needed at discharge.  Patient to Follow up at: Follow-up Information    [facility]. Go on 06/30/2016.   Why:  Please arrive to the walk-in clinic at  7AM an assessment for medication management and therapy.  Arrive as early as possible for prompt service. Please call Unk PintoHarvey Bryant at [phone] for questions and assistance. Contact information: [facility] [place] [address] [place] [address] Ph: [phone] Fax: [phone]          Next level of care provider has access to [facility] Link:no  Safety Planning and Suicide Prevention discussed: Yes,  reviewed with patient  Have you used any form of tobacco in the last 30 days? (Cigarettes, Smokeless Tobacco, Cigars, and/or Pipes): Yes  Has patient been referred to the Quitline?: N/A patient is not a smoker  Patient has been referred for addiction treatment: N/A  Lynden OxfordKadijah R Sagal Gayton, MSW, LCSW-A 06/26/2016, 9:43 AM

## 2016-06-26 NOTE — Progress Notes (Addendum)
Discharge Patient discharged from unit at 1159AM.  Patient discharge instructions reviewed with him and patient verbalized understanding of instructions.  Patient denies SI/HI/AVH at this time.  Patient appears to be in no acute distress at this time and voices no complaints or concerns.  Patient belongings returned to patient upon discharge.

## 2020-02-28 ENCOUNTER — Emergency Department: Payer: Medicare Other

## 2020-02-28 ENCOUNTER — Other Ambulatory Visit: Payer: Self-pay

## 2020-02-28 ENCOUNTER — Emergency Department
Admission: EM | Admit: 2020-02-28 | Discharge: 2020-02-28 | Disposition: A | Discharge status: Home / Self Care | Payer: Medicare Other | Attending: Student in an Organized Health Care Education/Training Program | Admitting: Student in an Organized Health Care Education/Training Program

## 2020-02-28 DIAGNOSIS — Y92410 Unspecified street and highway as the place of occurrence of the external cause: Secondary | ICD-10-CM | POA: Diagnosis not present

## 2020-02-28 DIAGNOSIS — F17210 Nicotine dependence, cigarettes, uncomplicated: Secondary | ICD-10-CM | POA: Diagnosis not present

## 2020-02-28 DIAGNOSIS — Y93I9 Activity, other involving external motion: Secondary | ICD-10-CM | POA: Insufficient documentation

## 2020-02-28 DIAGNOSIS — Z79899 Other long term (current) drug therapy: Secondary | ICD-10-CM | POA: Insufficient documentation

## 2020-02-28 DIAGNOSIS — Y999 Unspecified external cause status: Secondary | ICD-10-CM | POA: Insufficient documentation

## 2020-02-28 DIAGNOSIS — S161XXA Strain of muscle, fascia and tendon at neck level, initial encounter: Secondary | ICD-10-CM | POA: Diagnosis not present

## 2020-02-28 DIAGNOSIS — R519 Headache, unspecified: Secondary | ICD-10-CM | POA: Diagnosis not present

## 2020-02-28 DIAGNOSIS — S199XXA Unspecified injury of neck, initial encounter: Secondary | ICD-10-CM | POA: Diagnosis present

## 2020-02-28 MED ORDER — NAPROXEN 500 MG PO TABS: 500.0000 mg | ORAL_TABLET | Freq: Two times a day (BID) | ORAL | 0 refills | Status: AC
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
## 2020-03-20 ENCOUNTER — Emergency Department
Admission: EM | Admit: 2020-03-20 | Discharge: 2020-03-20 | Disposition: A | Discharge status: Home / Self Care | Payer: Medicare Other | Attending: Emergency Medicine | Admitting: Emergency Medicine

## 2020-03-20 ENCOUNTER — Emergency Department: Payer: Medicare Other

## 2020-03-20 ENCOUNTER — Other Ambulatory Visit: Payer: Self-pay

## 2020-03-20 DIAGNOSIS — Z79899 Other long term (current) drug therapy: Secondary | ICD-10-CM | POA: Insufficient documentation

## 2020-03-20 DIAGNOSIS — M545 Low back pain, unspecified: Secondary | ICD-10-CM

## 2020-03-20 DIAGNOSIS — F17210 Nicotine dependence, cigarettes, uncomplicated: Secondary | ICD-10-CM | POA: Diagnosis not present

## 2020-03-20 DIAGNOSIS — M549 Dorsalgia, unspecified: Secondary | ICD-10-CM | POA: Insufficient documentation

## 2020-03-20 MED ORDER — METHOCARBAMOL 500 MG PO TABS: 500.0000 mg | ORAL_TABLET | Freq: Three times a day (TID) | ORAL | 0 refills | Status: AC | PRN

## 2020-03-20 MED ORDER — KETOROLAC TROMETHAMINE 10 MG PO TABS: 10.0000 mg | ORAL_TABLET | Freq: Four times a day (QID) | ORAL | 0 refills | Status: AC | PRN

## 2020-03-20 MED ORDER — KETOROLAC TROMETHAMINE 30 MG/ML IJ SOLN
30.0000 mg | Freq: Once | INTRAMUSCULAR | Status: AC
  Administered 2020-03-20: 30 mg via INTRAMUSCULAR
  Filled 2020-03-20: qty 1

## 2020-03-20 MED ORDER — METHOCARBAMOL 500 MG PO TABS
1000.0000 mg | ORAL_TABLET | Freq: Once | ORAL | Status: AC
  Administered 2020-03-20: 1000 mg via ORAL
  Filled 2020-03-20: qty 2
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
## 2020-03-20 NOTE — ED Notes (Signed)
Pt to xray

## 2020-03-20 NOTE — ED Notes (Signed)
Pt denies loss of bladder or bowel.

## 2021-09-14 ENCOUNTER — Other Ambulatory Visit: Payer: Self-pay

## 2021-09-14 ENCOUNTER — Encounter: Payer: Self-pay | Admitting: Emergency Medicine

## 2021-09-14 DIAGNOSIS — Z5321 Procedure and treatment not carried out due to patient leaving prior to being seen by health care provider: Secondary | ICD-10-CM | POA: Diagnosis not present

## 2021-09-14 DIAGNOSIS — X102XXA Contact with fats and cooking oils, initial encounter: Secondary | ICD-10-CM | POA: Insufficient documentation

## 2021-09-14 DIAGNOSIS — T22231A Burn of second degree of right upper arm, initial encounter: Secondary | ICD-10-CM | POA: Insufficient documentation
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
## 2021-09-15 ENCOUNTER — Emergency Department
Admission: EM | Admit: 2021-09-15 | Discharge: 2021-09-15 | Disposition: A | Discharge status: Home / Self Care | Payer: Medicare Other | Attending: Emergency Medicine | Admitting: Emergency Medicine

## 2021-09-15 HISTORY — DX: Unspecified hearing loss, unspecified ear: H91.90

## 2021-09-15 NOTE — ED Notes (Signed)
No answer when called several times from lobby 

## 2021-10-10 ENCOUNTER — Emergency Department
Admission: EM | Admit: 2021-10-10 | Discharge: 2021-10-10 | Disposition: A | Discharge status: Home / Self Care | Payer: Medicare Other | Attending: Emergency Medicine | Admitting: Emergency Medicine

## 2021-10-10 ENCOUNTER — Other Ambulatory Visit: Payer: Self-pay

## 2021-10-10 DIAGNOSIS — S199XXA Unspecified injury of neck, initial encounter: Secondary | ICD-10-CM | POA: Diagnosis present

## 2021-10-10 DIAGNOSIS — S39012A Strain of muscle, fascia and tendon of lower back, initial encounter: Secondary | ICD-10-CM | POA: Insufficient documentation

## 2021-10-10 DIAGNOSIS — Y9389 Activity, other specified: Secondary | ICD-10-CM | POA: Diagnosis not present

## 2021-10-10 DIAGNOSIS — S46912A Strain of unspecified muscle, fascia and tendon at shoulder and upper arm level, left arm, initial encounter: Secondary | ICD-10-CM | POA: Diagnosis not present

## 2021-10-10 DIAGNOSIS — S161XXA Strain of muscle, fascia and tendon at neck level, initial encounter: Secondary | ICD-10-CM | POA: Diagnosis not present

## 2021-10-10 DIAGNOSIS — T148XXA Other injury of unspecified body region, initial encounter: Secondary | ICD-10-CM

## 2021-10-10 DIAGNOSIS — Y92410 Unspecified street and highway as the place of occurrence of the external cause: Secondary | ICD-10-CM | POA: Insufficient documentation

## 2021-10-10 MED ORDER — NAPROXEN 500 MG PO TABS: 500.0000 mg | ORAL_TABLET | Freq: Two times a day (BID) | ORAL | 0 refills | Status: AC
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
